# Patient Record
Sex: Male | Born: 2005 | Race: White | Hispanic: Yes | Marital: Single | Smoking: Never smoker
Health system: Southern US, Community
[De-identification: ages and names within clinical notes are randomized; demographics above are authoritative.]

## PROBLEM LIST (undated history)

## (undated) DIAGNOSIS — E669 Obesity, unspecified: Secondary | ICD-10-CM

## (undated) DIAGNOSIS — T7840XA Allergy, unspecified, initial encounter: Secondary | ICD-10-CM

## (undated) HISTORY — PX: TYMPANOSTOMY TUBE PLACEMENT: SHX32

## (undated) HISTORY — DX: Allergy, unspecified, initial encounter: T78.40XA

## (undated) HISTORY — DX: Obesity, unspecified: E66.9

---
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
## 2023-10-06 ENCOUNTER — Other Ambulatory Visit: Payer: Self-pay | Admitting: Pediatrics

## 2023-10-06 DIAGNOSIS — J302 Other seasonal allergic rhinitis: Secondary | ICD-10-CM

## 2024-04-01 ENCOUNTER — Ambulatory Visit: Admitting: Pediatrics
